# Patient Record
Sex: Male | Born: 1970 | Hispanic: No | Smoking: Former smoker
Health system: Southern US, Community
[De-identification: ages and names within clinical notes are randomized; demographics above are authoritative.]

## PROBLEM LIST (undated history)

## (undated) DIAGNOSIS — I10 Essential (primary) hypertension: Secondary | ICD-10-CM

## (undated) HISTORY — DX: Essential (primary) hypertension: I10

---

## 2003-05-25 ENCOUNTER — Encounter: Admission: RE | Admit: 2003-05-25 | Discharge: 2003-05-25 | Payer: Self-pay | Admitting: Occupational Medicine

## 2005-09-22 ENCOUNTER — Emergency Department (HOSPITAL_COMMUNITY): Admission: EM | Admit: 2005-09-22 | Discharge: 2005-09-22 | Payer: Self-pay | Admitting: Emergency Medicine

## 2009-02-10 ENCOUNTER — Encounter: Admission: RE | Admit: 2009-02-10 | Discharge: 2009-02-10 | Payer: Self-pay | Admitting: Internal Medicine

## 2010-12-11 IMAGING — CR DG CHEST 1V
1 series · 1 of 1 positions shown · non-contrast
Comparison: None

CLINICAL DATA: Pre employment physical

CHEST - 1 VIEW

[view not recorded]
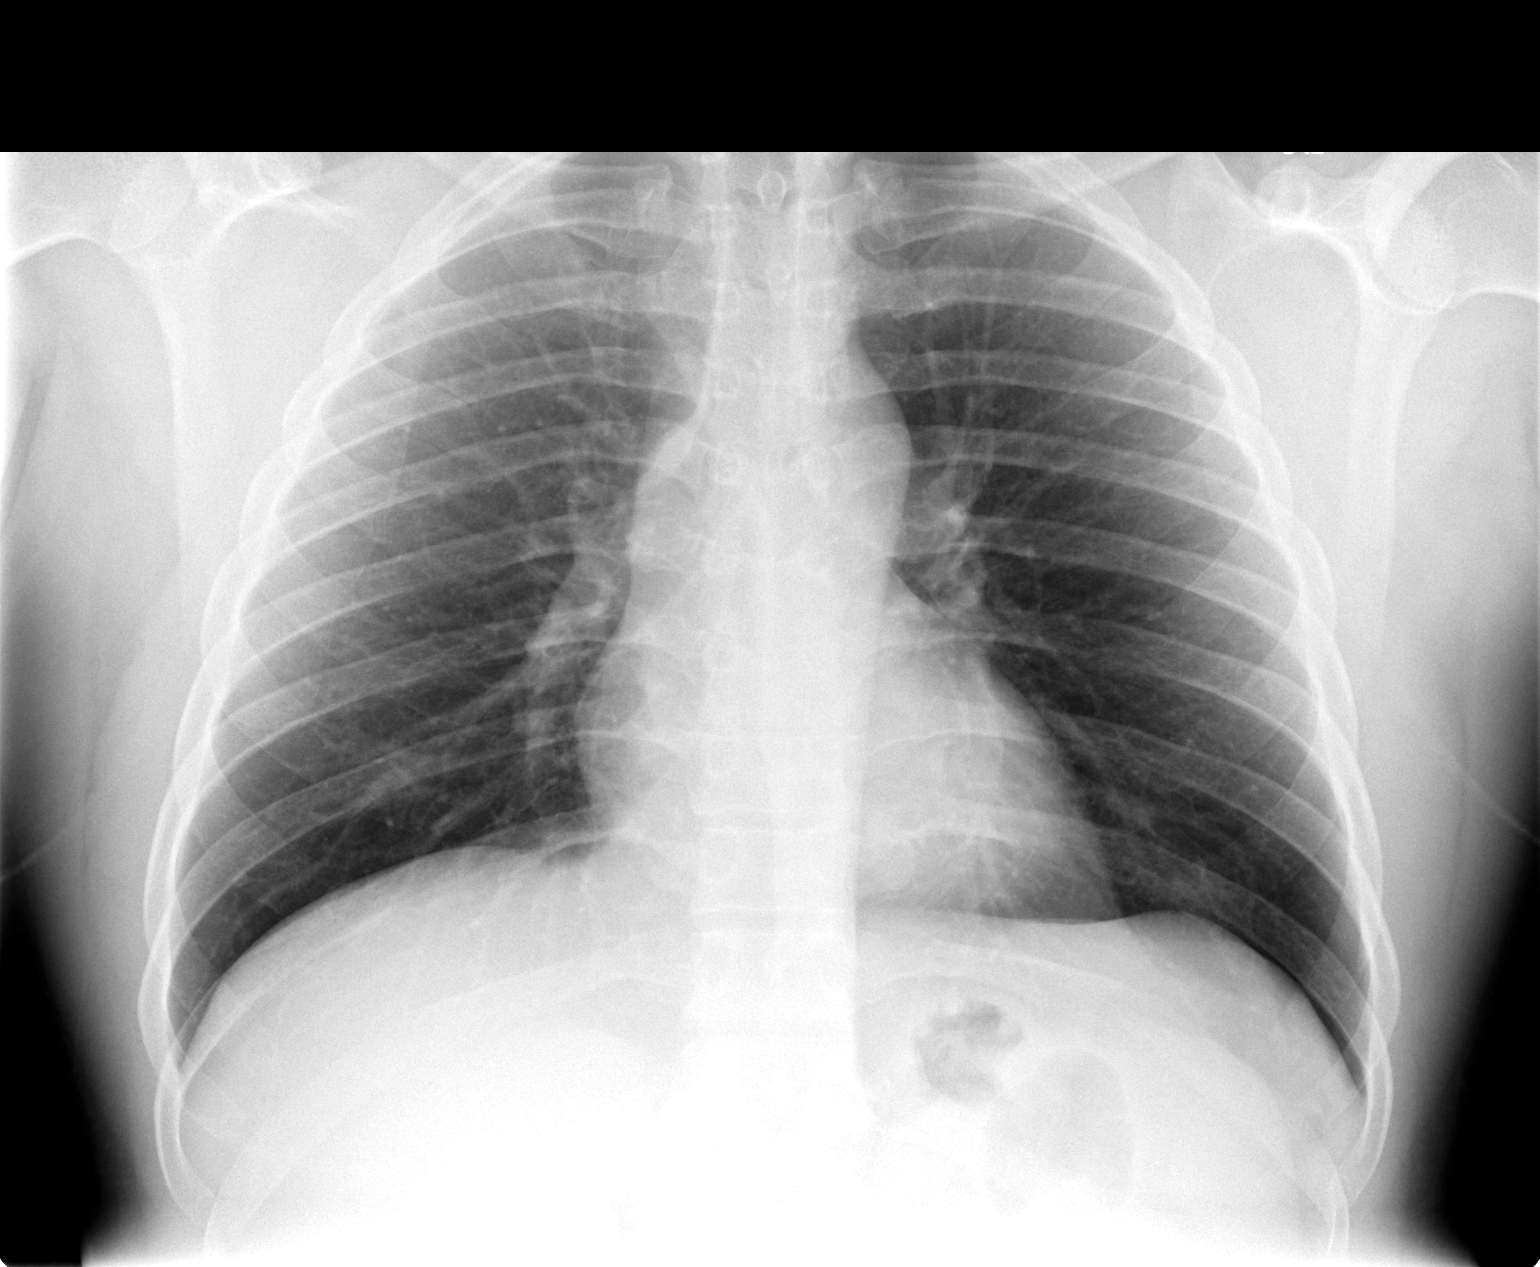

[1 of 1 positions shown; findings below may reference images not displayed]

FINDINGS: Heart and mediastinal contours normal.  Lungs clear.
Osseous structures intact in one-view.
IMPRESSION: No acute or significant findings.

## 2014-03-17 DIAGNOSIS — E669 Obesity, unspecified: Secondary | ICD-10-CM | POA: Insufficient documentation

## 2015-09-27 DIAGNOSIS — R7989 Other specified abnormal findings of blood chemistry: Secondary | ICD-10-CM | POA: Insufficient documentation

## 2016-10-30 DIAGNOSIS — N189 Chronic kidney disease, unspecified: Secondary | ICD-10-CM | POA: Insufficient documentation

## 2016-10-30 DIAGNOSIS — E785 Hyperlipidemia, unspecified: Secondary | ICD-10-CM | POA: Insufficient documentation

## 2019-02-16 DIAGNOSIS — R030 Elevated blood-pressure reading, without diagnosis of hypertension: Secondary | ICD-10-CM | POA: Diagnosis not present

## 2019-02-17 DIAGNOSIS — R030 Elevated blood-pressure reading, without diagnosis of hypertension: Secondary | ICD-10-CM | POA: Diagnosis not present

## 2019-02-18 DIAGNOSIS — R030 Elevated blood-pressure reading, without diagnosis of hypertension: Secondary | ICD-10-CM | POA: Diagnosis not present

## 2019-02-19 DIAGNOSIS — Z0130 Encounter for examination of blood pressure without abnormal findings: Secondary | ICD-10-CM | POA: Diagnosis not present

## 2019-02-20 DIAGNOSIS — I10 Essential (primary) hypertension: Secondary | ICD-10-CM | POA: Diagnosis not present

## 2019-02-25 DIAGNOSIS — I10 Essential (primary) hypertension: Secondary | ICD-10-CM | POA: Diagnosis not present

## 2019-02-28 DIAGNOSIS — I10 Essential (primary) hypertension: Secondary | ICD-10-CM | POA: Diagnosis not present

## 2019-03-03 DIAGNOSIS — I10 Essential (primary) hypertension: Secondary | ICD-10-CM | POA: Diagnosis not present

## 2019-03-05 DIAGNOSIS — Z113 Encounter for screening for infections with a predominantly sexual mode of transmission: Secondary | ICD-10-CM | POA: Diagnosis not present

## 2019-03-05 DIAGNOSIS — Z13220 Encounter for screening for lipoid disorders: Secondary | ICD-10-CM | POA: Diagnosis not present

## 2019-03-05 DIAGNOSIS — Z131 Encounter for screening for diabetes mellitus: Secondary | ICD-10-CM | POA: Diagnosis not present

## 2019-03-05 DIAGNOSIS — I10 Essential (primary) hypertension: Secondary | ICD-10-CM | POA: Diagnosis not present

## 2019-03-26 DIAGNOSIS — Z7189 Other specified counseling: Secondary | ICD-10-CM | POA: Diagnosis not present

## 2019-03-26 DIAGNOSIS — I10 Essential (primary) hypertension: Secondary | ICD-10-CM | POA: Diagnosis not present

## 2019-03-26 DIAGNOSIS — D72819 Decreased white blood cell count, unspecified: Secondary | ICD-10-CM | POA: Diagnosis not present

## 2019-03-26 DIAGNOSIS — R7303 Prediabetes: Secondary | ICD-10-CM | POA: Diagnosis not present

## 2019-05-06 DIAGNOSIS — Z0130 Encounter for examination of blood pressure without abnormal findings: Secondary | ICD-10-CM | POA: Diagnosis not present

## 2019-08-16 DIAGNOSIS — Z13220 Encounter for screening for lipoid disorders: Secondary | ICD-10-CM | POA: Diagnosis not present

## 2019-08-16 DIAGNOSIS — I10 Essential (primary) hypertension: Secondary | ICD-10-CM | POA: Diagnosis not present

## 2019-08-16 DIAGNOSIS — D72819 Decreased white blood cell count, unspecified: Secondary | ICD-10-CM | POA: Diagnosis not present

## 2019-08-16 DIAGNOSIS — Z131 Encounter for screening for diabetes mellitus: Secondary | ICD-10-CM | POA: Diagnosis not present

## 2019-08-16 DIAGNOSIS — R7303 Prediabetes: Secondary | ICD-10-CM | POA: Diagnosis not present

## 2019-10-20 DIAGNOSIS — Z03818 Encounter for observation for suspected exposure to other biological agents ruled out: Secondary | ICD-10-CM | POA: Diagnosis not present

## 2019-10-20 DIAGNOSIS — Z20828 Contact with and (suspected) exposure to other viral communicable diseases: Secondary | ICD-10-CM | POA: Diagnosis not present

## 2019-12-27 DIAGNOSIS — D72819 Decreased white blood cell count, unspecified: Secondary | ICD-10-CM | POA: Diagnosis not present

## 2019-12-27 DIAGNOSIS — E782 Mixed hyperlipidemia: Secondary | ICD-10-CM | POA: Diagnosis not present

## 2019-12-27 DIAGNOSIS — Z13220 Encounter for screening for lipoid disorders: Secondary | ICD-10-CM | POA: Diagnosis not present

## 2019-12-27 DIAGNOSIS — I10 Essential (primary) hypertension: Secondary | ICD-10-CM | POA: Diagnosis not present

## 2019-12-27 DIAGNOSIS — R202 Paresthesia of skin: Secondary | ICD-10-CM | POA: Diagnosis not present

## 2019-12-27 DIAGNOSIS — Z131 Encounter for screening for diabetes mellitus: Secondary | ICD-10-CM | POA: Diagnosis not present

## 2019-12-27 DIAGNOSIS — R7303 Prediabetes: Secondary | ICD-10-CM | POA: Diagnosis not present

## 2020-01-14 DIAGNOSIS — Z20822 Contact with and (suspected) exposure to covid-19: Secondary | ICD-10-CM | POA: Diagnosis not present

## 2020-04-10 DIAGNOSIS — R7303 Prediabetes: Secondary | ICD-10-CM | POA: Diagnosis not present

## 2020-04-10 DIAGNOSIS — Z7189 Other specified counseling: Secondary | ICD-10-CM | POA: Diagnosis not present

## 2020-04-10 DIAGNOSIS — I10 Essential (primary) hypertension: Secondary | ICD-10-CM | POA: Diagnosis not present

## 2020-04-27 NOTE — Progress Notes (Signed)
Patient referred by [name], NP for hypertension  Subjective:   Adam Silva, male    DOB: 1970/10/07, 49 y.o.   MRN: [id number]   Chief Complaint  Patient presents with  . Hypertension  . AV  . New Patient (Initial Visit)     HPI  49 y.o. African American male with hypertension  Patient is originally from [place], works in a [employer]. He exercises regularly, has had pain in his fingers, but denies chest pain, shortness of breath, palpitations, leg edema, orthopnea, PND, TIA/syncope. Blood pressure mildly;y elevated in office today, 120/80 mmHg on checks at his workplace., He states he is now compliant with chlorthalidone and atorvastatin.  He smokes a cigarette or two at work once every 3 months at work, as a part of Designer, jewellery. He does not smoke tobacco daily, does smoke cannabis twice a week. He drinks occasionally on weekends-3 to 4 beers.   Past Medical History:  Diagnosis Date  . Hypertension      History reviewed. No pertinent surgical history.   Social History   Tobacco Use  Smoking Status Current Every Day Smoker  . Packs/day: 0.25  . Types: Cigarettes  Smokeless Tobacco Never Used  Tobacco Comment   socially    Social History   Substance and Sexual Activity  Alcohol Use Yes   Comment: occ     Family History  Problem Relation Age of Onset  . Hypertension Mother   . Heart disease Father   . Heart attack Father   . Hypertension Father      Current Outpatient Medications on File Prior to Visit  Medication Sig Dispense Refill  . Ascorbic Acid (VITAMIN C) 1000 MG tablet Take 1,000 mg by mouth daily.    Marland Kitchen atorvastatin (LIPITOR) 20 MG tablet Take 1 tablet by mouth at bedtime.    . chlorthalidone (HYGROTON) 25 MG tablet Take 1 tablet by mouth daily.     No current facility-administered medications on file prior to visit.    Cardiovascular and other pertinent studies:  EKG 04/28/2020: Sinus rhythm 56 bpm Borderline left  atrial enlargement Diffuse nonspecific T-abnormality  Recent labs: 12/29/2019: Glucose 103, BUN/Cr 15/1.3. EGFR 63/73. Na/K 139/3.7. Rest of the CMP normal H/H 15/46. MCV 83. Platelets 203 HbA1C 5.8% Chol 199, TG 52, HDL 99, LDL 87    Review of Systems  Cardiovascular: Negative for chest pain, dyspnea on exertion, leg swelling, palpitations and syncope.         Vitals:   04/28/20 1045  BP: (!) 141/89  Pulse: 65  Resp: 17  SpO2: 97%     Body mass index is 32.26 kg/m. Filed Weights   04/28/20 1045  Weight: 206 lb (93.4 kg)     Objective:   Physical Exam Vitals and nursing note reviewed.  Constitutional:      General: He is not in acute distress. Neck:     Vascular: No JVD.  Cardiovascular:     Rate and Rhythm: Normal rate and regular rhythm.     Heart sounds: Normal heart sounds. No murmur heard.   Pulmonary:     Effort: Pulmonary effort is normal.     Breath sounds: Normal breath sounds. No wheezing or rales.         Assessment & Recommendations:   49 y.o. African American male with hypertension  Hypertension: Reasonably well controlled. In future, could add amlodipine 5 mg, if necessary. Will obtain echocardiogram.  Lipids well controlled on atorvastatin.  F/u  as needed  Thank you for referring the patient to [place]. Please feel free to contact with any questions.   Nigel Mormon, MD Pager: [phone] Office: [phone]

## 2020-04-28 ENCOUNTER — Ambulatory Visit: Payer: BC Managed Care – PPO | Admitting: Cardiology

## 2020-04-28 ENCOUNTER — Other Ambulatory Visit: Payer: Self-pay

## 2020-04-28 ENCOUNTER — Encounter: Payer: Self-pay | Admitting: Cardiology

## 2020-04-28 VITALS — BP 141/89 | HR 65 | Resp 17 | Ht 67.0 in | Wt 206.0 lb

## 2020-04-28 DIAGNOSIS — I10 Essential (primary) hypertension: Secondary | ICD-10-CM | POA: Insufficient documentation

## 2020-04-29 ENCOUNTER — Ambulatory Visit: Payer: BC Managed Care – PPO

## 2020-04-29 DIAGNOSIS — I10 Essential (primary) hypertension: Secondary | ICD-10-CM | POA: Diagnosis not present

## 2020-05-03 NOTE — Progress Notes (Signed)
Unable to reach pt. Left vm to cb. 

## 2020-05-03 NOTE — Progress Notes (Signed)
Called patient, NA, LMAM

## 2020-05-27 DIAGNOSIS — Z125 Encounter for screening for malignant neoplasm of prostate: Secondary | ICD-10-CM | POA: Diagnosis not present

## 2020-05-27 DIAGNOSIS — R102 Pelvic and perineal pain: Secondary | ICD-10-CM | POA: Diagnosis not present

## 2020-08-04 DIAGNOSIS — R1031 Right lower quadrant pain: Secondary | ICD-10-CM | POA: Diagnosis not present

## 2020-08-04 DIAGNOSIS — R109 Unspecified abdominal pain: Secondary | ICD-10-CM | POA: Insufficient documentation

## 2020-08-24 DIAGNOSIS — E782 Mixed hyperlipidemia: Secondary | ICD-10-CM | POA: Diagnosis not present

## 2020-08-24 DIAGNOSIS — I10 Essential (primary) hypertension: Secondary | ICD-10-CM | POA: Diagnosis not present

## 2020-08-24 DIAGNOSIS — R7303 Prediabetes: Secondary | ICD-10-CM | POA: Diagnosis not present

## 2020-08-24 DIAGNOSIS — Z7189 Other specified counseling: Secondary | ICD-10-CM | POA: Diagnosis not present

## 2023-08-06 DIAGNOSIS — I10 Essential (primary) hypertension: Secondary | ICD-10-CM | POA: Diagnosis not present

## 2023-08-30 ENCOUNTER — Encounter (HOSPITAL_BASED_OUTPATIENT_CLINIC_OR_DEPARTMENT_OTHER): Payer: Self-pay | Admitting: Family Medicine

## 2023-08-30 ENCOUNTER — Ambulatory Visit (HOSPITAL_BASED_OUTPATIENT_CLINIC_OR_DEPARTMENT_OTHER): Payer: BC Managed Care – PPO | Admitting: Family Medicine

## 2023-08-30 VITALS — BP 145/90 | HR 80 | Ht 67.0 in | Wt 203.4 lb

## 2023-08-30 DIAGNOSIS — E785 Hyperlipidemia, unspecified: Secondary | ICD-10-CM | POA: Diagnosis not present

## 2023-08-30 DIAGNOSIS — I10 Essential (primary) hypertension: Secondary | ICD-10-CM | POA: Diagnosis not present

## 2023-08-30 DIAGNOSIS — Z1211 Encounter for screening for malignant neoplasm of colon: Secondary | ICD-10-CM | POA: Diagnosis not present

## 2023-08-30 DIAGNOSIS — Z0000 Encounter for general adult medical examination without abnormal findings: Secondary | ICD-10-CM

## 2023-08-30 NOTE — Progress Notes (Signed)
 New Patient Office Visit  Subjective   Patient ID: Adam Silva, male    DOB: [birth year]  Age: 53 y.o. MRN: [id number]  CC:  Chief Complaint  Patient presents with   New Patient (Initial Visit)    New Patient would like to discuss blood pressure and has not had blood work done in over a year need bp medication refilled amlodipine      HPI Adam Silva presents to establish care Last PCP - [facility]  Hypertension: currently taking amlodipine  5 mg. In the past, has taken chlorthalidone , did not have any issues with medication. Does check BP occasionally at home - 120-130s/70-80s typically. Has been taking amlodipine  for about 2 years.  Chart also notes CKD, HLD. Has not had blood work in >1 year, none available to review in chart.  Patient is originally from [place]. Has lived here for about 22 years. Patient works as event organiser. He enjoys exercising, traveling.  Outpatient Encounter Medications as of 08/30/2023  Medication Sig   amLODipine  (NORVASC ) 5 MG tablet Take 5 mg by mouth daily.   Multiple Vitamin (MULTI-VITAMIN) tablet Take 1 tablet by mouth daily.   [DISCONTINUED] Ascorbic Acid (VITAMIN C) 1000 MG tablet Take 1,000 mg by mouth daily.   [DISCONTINUED] atorvastatin (LIPITOR) 20 MG tablet Take 1 tablet by mouth at bedtime.   [DISCONTINUED] chlorthalidone  (HYGROTON ) 25 MG tablet Take 1 tablet by mouth daily.   No facility-administered encounter medications on file as of 08/30/2023.    Past Medical History:  Diagnosis Date   Hypertension     History reviewed. No pertinent surgical history.  Family History  Problem Relation Age of Onset   Hypertension Mother    Heart disease Father    Heart attack Father    Hypertension Father     Social History   Socioeconomic History   Marital status: Divorced    Spouse name: Not on file   Number of children: 1   Years of education: Not on file   Highest education level: Bachelor's degree (e.g., BA, AB, BS)   Occupational History   Not on file  Tobacco Use   Smoking status: Former    Current packs/day: 0.25    Types: Cigarettes    Passive exposure: Past   Smokeless tobacco: Never   Tobacco comments:    socially  Vaping Use   Vaping status: Former  Substance and Sexual Activity   Alcohol use: Yes    Comment: occ   Drug use: Yes    Types: Marijuana   Sexual activity: Not on file  Other Topics Concern   Not on file  Social History Narrative   Not on file   Social Drivers of Health   Financial Resource Strain: Low Risk  (08/27/2023)   Overall Financial Resource Strain (CARDIA)    Difficulty of Paying Living Expenses: Not very hard  Food Insecurity: No Food Insecurity (08/27/2023)   Hunger Vital Sign    Worried About Running Out of Food in the Last Year: Never true    Ran Out of Food in the Last Year: Never true  Transportation Needs: Unknown (08/27/2023)   PRAPARE - Administrator, Civil Service (Medical): Not on file    Lack of Transportation (Non-Medical): No  Physical Activity: Sufficiently Active (08/27/2023)   Exercise Vital Sign    Days of Exercise per Week: 5 days    Minutes of Exercise per Session: 60 min  Stress: No Stress Concern Present (08/27/2023)  [employer] of Occupational Health - Occupational Stress Questionnaire    Feeling of Stress : Not at all  Social Connections: Moderately Isolated (08/27/2023)   Social Connection and Isolation Panel [NHANES]    Frequency of Communication with Friends and Family: Twice a week    Frequency of Social Gatherings with Friends and Family: Once a week    Attends Religious Services: 1 to 4 times per year    Active Member of [employer] or Organizations: No    Attends Engineer, Structural: Not on file    Marital Status: Divorced  Catering Manager Violence: Not on file    Objective   BP (!) 149/96 (BP Location: Right Arm, Patient Position: Sitting, Cuff Size: Normal)   Pulse 80   Ht 5' 7 (1.702 m)   Wt 203  lb 6.4 oz (92.3 kg)   SpO2 100%   BMI 31.86 kg/m   Physical Exam  53 year old male in no acute distress Cardiovascular exam with regular rate and rhythm Lungs clear to auscultation bilaterally  Assessment & Plan:   Essential hypertension Assessment & Plan: Blood pressure slightly elevated on initial reading, did improve slightly on recheck.  Can continue with current medication regimen given better control reported at home.  We will continue to monitor blood pressure moving forward. We will check baseline labs with upcoming physical   Colon cancer screening -     Ambulatory referral to Gastroenterology  Wellness examination -     CBC with Differential/Platelet; Future -     Comprehensive metabolic panel; Future -     Hemoglobin A1c; Future -     Lipid panel; Future -     TSH Rfx on Abnormal to Free T4; Future  Hyperlipidemia, unspecified hyperlipidemia type Assessment & Plan: Continue with lifestyle modifications Check labs before CPE   Return in about 1 month (around 09/27/2023) for CPE with fasting labs 1 week prior.    ___________________________________________ Nathen Balaban de [place], MD, ABFM, CAQSM Primary Care and Sports Medicine [facility]

## 2023-08-30 NOTE — Assessment & Plan Note (Signed)
 Continue with lifestyle modifications Check labs before CPE

## 2023-08-30 NOTE — Assessment & Plan Note (Signed)
 Blood pressure slightly elevated on initial reading, did improve slightly on recheck.  Can continue with current medication regimen given better control reported at home.  We will continue to monitor blood pressure moving forward. We will check baseline labs with upcoming physical

## 2023-08-30 NOTE — Patient Instructions (Signed)
  Medication Instructions:  Your physician recommends that you continue on your current medications as directed. Please refer to the Current Medication list given to you today. --If you need a refill on any your medications before your next appointment, please call your pharmacy first. If no refills are authorized on file call the office.-- Lab Work: Your physician has recommended that you have lab work today: 1 week before next visit  If you have labs (blood work) drawn today and your tests are completely normal, you will receive your results via MyChart message OR a phone call from our staff.  Please ensure you check your voicemail in the event that you authorized detailed messages to be left on a delegated number. If you have any lab test that is abnormal or we need to change your treatment, we will call you to review the results.    Follow-Up: Your next appointment:   Your physician recommends that you schedule a follow-up appointment in: 1 month physical with Dr. [name]  You will receive a text message or e-mail with a link to a survey about your care and experience with [place] today! We would greatly appreciate your feedback!   Thanks for letting us be apart of your health journey!!  Primary Care and Sports Medicine   Dr. Ceasar Mons [place]   We encourage you to activate your patient portal called "MyChart".  Sign up information is provided on this After Visit Summary.  MyChart is used to connect with patients for Virtual Visits (Telemedicine).  Patients are able to view lab/test results, encounter notes, upcoming appointments, etc.  Non-urgent messages can be sent to your provider as well. To learn more about what you can do with MyChart, please visit --  [email or web address].

## 2023-10-03 ENCOUNTER — Other Ambulatory Visit (HOSPITAL_BASED_OUTPATIENT_CLINIC_OR_DEPARTMENT_OTHER): Payer: Self-pay | Admitting: *Deleted

## 2023-10-03 DIAGNOSIS — Z0000 Encounter for general adult medical examination without abnormal findings: Secondary | ICD-10-CM

## 2023-10-04 LAB — CBC WITH DIFFERENTIAL/PLATELET
Basophils Absolute: 0 10*3/uL (ref 0.0–0.2)
Basos: 1 %
EOS (ABSOLUTE): 0 10*3/uL (ref 0.0–0.4)
Eos: 1 %
Hematocrit: 49.2 % (ref 37.5–51.0)
Hemoglobin: 16.4 g/dL (ref 13.0–17.7)
Immature Grans (Abs): 0 10*3/uL (ref 0.0–0.1)
Immature Granulocytes: 0 %
Lymphocytes Absolute: 1.6 10*3/uL (ref 0.7–3.1)
Lymphs: 58 %
MCH: 27.7 pg (ref 26.6–33.0)
MCHC: 33.3 g/dL (ref 31.5–35.7)
MCV: 83 fL (ref 79–97)
Monocytes Absolute: 0.4 10*3/uL (ref 0.1–0.9)
Monocytes: 12 %
Neutrophils Absolute: 0.8 10*3/uL — ABNORMAL LOW (ref 1.4–7.0)
Neutrophils: 28 %
Platelets: 163 10*3/uL (ref 150–450)
RBC: 5.91 x10E6/uL — ABNORMAL HIGH (ref 4.14–5.80)
RDW: 13.2 % (ref 11.6–15.4)
WBC: 2.9 10*3/uL — ABNORMAL LOW (ref 3.4–10.8)

## 2023-10-04 LAB — COMPREHENSIVE METABOLIC PANEL
ALT: 27 IU/L (ref 0–44)
AST: 36 IU/L (ref 0–40)
Albumin: 4.7 g/dL (ref 3.8–4.9)
Alkaline Phosphatase: 70 IU/L (ref 44–121)
BUN/Creatinine Ratio: 9 (ref 9–20)
BUN: 14 mg/dL (ref 6–24)
Bilirubin Total: 1 mg/dL (ref 0.0–1.2)
CO2: 26 mmol/L (ref 20–29)
Calcium: 10.1 mg/dL (ref 8.7–10.2)
Chloride: 103 mmol/L (ref 96–106)
Creatinine, Ser: 1.5 mg/dL — ABNORMAL HIGH (ref 0.76–1.27)
Globulin, Total: 2.5 g/dL (ref 1.5–4.5)
Glucose: 99 mg/dL (ref 70–99)
Potassium: 4.7 mmol/L (ref 3.5–5.2)
Sodium: 141 mmol/L (ref 134–144)
Total Protein: 7.2 g/dL (ref 6.0–8.5)
eGFR: 56 mL/min/{1.73_m2} — ABNORMAL LOW (ref >59)

## 2023-10-04 LAB — LIPID PANEL
Chol/HDL Ratio: 2 ratio (ref 0.0–5.0)
Cholesterol, Total: 241 mg/dL — ABNORMAL HIGH (ref 100–199)
HDL: 119 mg/dL (ref >39)
LDL Chol Calc (NIH): 114 mg/dL — ABNORMAL HIGH (ref 0–99)
Triglycerides: 47 mg/dL (ref 0–149)
VLDL Cholesterol Cal: 8 mg/dL (ref 5–40)

## 2023-10-04 LAB — HEMOGLOBIN A1C
Est. average glucose Bld gHb Est-mCnc: 123 mg/dL
Hgb A1c MFr Bld: 5.9 % — ABNORMAL HIGH (ref 4.8–5.6)

## 2023-10-04 LAB — TSH RFX ON ABNORMAL TO FREE T4: TSH: 0.86 u[IU]/mL (ref 0.450–4.500)

## 2023-10-09 ENCOUNTER — Ambulatory Visit (INDEPENDENT_AMBULATORY_CARE_PROVIDER_SITE_OTHER): Payer: BC Managed Care – PPO | Admitting: Family Medicine

## 2023-10-09 ENCOUNTER — Other Ambulatory Visit (HOSPITAL_BASED_OUTPATIENT_CLINIC_OR_DEPARTMENT_OTHER): Payer: Self-pay

## 2023-10-09 ENCOUNTER — Encounter (HOSPITAL_BASED_OUTPATIENT_CLINIC_OR_DEPARTMENT_OTHER): Payer: Self-pay | Admitting: Family Medicine

## 2023-10-09 VITALS — BP 155/110 | HR 80 | Ht 67.0 in | Wt 201.3 lb

## 2023-10-09 DIAGNOSIS — Z0000 Encounter for general adult medical examination without abnormal findings: Secondary | ICD-10-CM | POA: Diagnosis not present

## 2023-10-09 DIAGNOSIS — D709 Neutropenia, unspecified: Secondary | ICD-10-CM | POA: Diagnosis not present

## 2023-10-09 DIAGNOSIS — Z1211 Encounter for screening for malignant neoplasm of colon: Secondary | ICD-10-CM

## 2023-10-09 DIAGNOSIS — I10 Essential (primary) hypertension: Secondary | ICD-10-CM

## 2023-10-09 DIAGNOSIS — N1831 Chronic kidney disease, stage 3a: Secondary | ICD-10-CM

## 2023-10-09 MED ORDER — CHLORTHALIDONE 25 MG PO TABS
25.0000 mg | ORAL_TABLET | Freq: Every day | ORAL | 0 refills | Status: DC
  Filled 2023-10-09: qty 30, 30d supply, fill #0
  Filled 2023-11-05: qty 30, 30d supply, fill #1

## 2023-10-09 NOTE — Assessment & Plan Note (Signed)
 Blood pressure elevated in office today.  Does continue with amlodipine.  He reports that he was on chlorthalidone at 1 point the past and felt that this was helpful in regards to blood pressure management.  We discussed options today and he would like to resume this medication in conjunction with amlodipine.  I feel that this would be reasonable.  Prescription sent to pharmacy.  Discussed that if blood pressure is running too low, can cut dose of chlorthalidone in half by taking half tablet daily.  If having persistent issues with blood pressure, recommend returning to the office for further evaluation

## 2023-10-09 NOTE — Patient Instructions (Signed)
  Medication Instructions:  Your physician recommends that you continue on your current medications as directed. Please refer to the Current Medication list given to you today. --If you need a refill on any your medications before your next appointment, please call your pharmacy first. If no refills are authorized on file call the office.-- Lab Work: Your physician has recommended that you have lab work today: 1 week before next visit If you have labs (blood work) drawn today and your tests are completely normal, you will receive your results via MyChart message OR a phone call from our staff.  Please ensure you check your voicemail in the event that you authorized detailed messages to be left on a delegated number. If you have any lab test that is abnormal or we need to change your treatment, we will call you to review the results.    Follow-Up: Your next appointment:   Your physician recommends that you schedule a follow-up appointment in: 6 weeks follow up  with Dr. [name]  You will receive a text message or e-mail with a link to a survey about your care and experience with [place] today! We would greatly appreciate your feedback!   Thanks for letting us be apart of your health journey!!  Primary Care and Sports Medicine   Dr. Ceasar Mons [place]   We encourage you to activate your patient portal called "MyChart".  Sign up information is provided on this After Visit Summary.  MyChart is used to connect with patients for Virtual Visits (Telemedicine).  Patients are able to view lab/test results, encounter notes, upcoming appointments, etc.  Non-urgent messages can be sent to your provider as well. To learn more about what you can do with MyChart, please visit --  [email or web address].

## 2023-10-09 NOTE — Progress Notes (Signed)
 Subjective:    CC: Annual Physical Exam  HPI:  Adam Silva is a 53 y.o. presenting for annual physical  I reviewed the past medical history, family history, social history, surgical history, and allergies today and no changes were needed.  Please see the problem list section below in epic for further details.  Past Medical History: Past Medical History:  Diagnosis Date   Hypertension    Past Surgical History: History reviewed. No pertinent surgical history. Social History: Social History   Socioeconomic History   Marital status: Divorced    Spouse name: Not on file   Number of children: 1   Years of education: Not on file   Highest education level: Bachelor's degree (e.g., BA, AB, BS)  Occupational History   Not on file  Tobacco Use   Smoking status: Former    Current packs/day: 0.25    Types: Cigarettes    Passive exposure: Past   Smokeless tobacco: Never   Tobacco comments:    socially  Vaping Use   Vaping status: Former  Substance and Sexual Activity   Alcohol use: Yes    Comment: occ   Drug use: Yes    Types: Marijuana   Sexual activity: Not on file  Other Topics Concern   Not on file  Social History Narrative   Not on file   Social Drivers of Health   Financial Resource Strain: Low Risk  (08/27/2023)   Overall Financial Resource Strain (CARDIA)    Difficulty of Paying Living Expenses: Not very hard  Food Insecurity: No Food Insecurity (08/27/2023)   Hunger Vital Sign    Worried About Running Out of Food in the Last Year: Never true    Ran Out of Food in the Last Year: Never true  Transportation Needs: Unknown (08/27/2023)   PRAPARE - Administrator, Civil Service (Medical): Not on file    Lack of Transportation (Non-Medical): No  Physical Activity: Sufficiently Active (08/27/2023)   Exercise Vital Sign    Days of Exercise per Week: 5 days    Minutes of Exercise per Session: 60 min  Stress: No Stress Concern Present (08/27/2023)   [employer] of Occupational Health - Occupational Stress Questionnaire    Feeling of Stress : Not at all  Social Connections: Moderately Isolated (08/27/2023)   Social Connection and Isolation Panel [NHANES]    Frequency of Communication with Friends and Family: Twice a week    Frequency of Social Gatherings with Friends and Family: Once a week    Attends Religious Services: 1 to 4 times per year    Active Member of [employer] or Organizations: No    Attends Engineer, structural: Not on file    Marital Status: Divorced   Family History: Family History  Problem Relation Age of Onset   Hypertension Mother    Heart disease Father    Heart attack Father    Hypertension Father    Allergies: No Active Allergies  Medications: See med rec.  Review of Systems: No headache, visual changes, nausea, vomiting, diarrhea, constipation, dizziness, abdominal pain, skin rash, fevers, chills, night sweats, swollen lymph nodes, weight loss, chest pain, body aches, joint swelling, muscle aches, shortness of breath, mood changes, visual or auditory hallucinations.  Objective:    BP (!) 154/92 (BP Location: Left Arm, Patient Position: Sitting, Cuff Size: Normal)   Pulse 80   Ht 5\' 7"  (1.702 m)   Wt 201 lb 4.8 oz (91.3 kg)   SpO2  99%   BMI 31.53 kg/m   General: Well Developed, well nourished, and in no acute distress. Neuro: Alert and oriented x3, extra-ocular muscles intact, sensation grossly intact. Cranial nerves II through XII are intact, motor, sensory, and coordinative functions are all intact. HEENT: Normocephalic, atraumatic, pupils equal round reactive to light, neck supple, no masses, no lymphadenopathy, thyroid nonpalpable. Oropharynx, nasopharynx, external ear canals are unremarkable. Skin: Warm and dry, no rashes noted. Cardiac: Regular rate and rhythm, no murmurs rubs or gallops. Respiratory: Clear to auscultation bilaterally. Not using accessory muscles, speaking in full  sentences. Abdominal: Soft, nontender, nondistended, positive bowel sounds, no masses, no organomegaly. Musculoskeletal: Shoulder, elbow, wrist, hip, knee, ankle stable, and with full range of motion.  Impression and Recommendations:    Wellness examination Assessment & Plan: Routine HCM labs reviewed. HCM reviewed/discussed. Anticipatory guidance regarding healthy weight, lifestyle and choices given. Recommend healthy diet.  Recommend approximately 150 minutes/week of moderate intensity exercise Recommend regular dental and vision exams Always use seatbelt/lap and shoulder restraints Recommend using smoke alarms and checking batteries at least twice a year Recommend using sunscreen when outside Discussed colon cancer screening recommendations, options.  Patient would like to proceed with colonoscopy, referral placed Discussed recommendations for shingles vaccine.  Patient will consider Discussed tetanus immunization recommendations, patient is UTD   Neutropenia, unspecified type [facility]) Assessment & Plan: Noted on recent labs, mild to moderate in nature.  No known history.  Can proceed with recheck of labs in the near future, he will return to have labs completed before our next appointment for monitoring.  If persisting, consider referral to hematology for further evaluation  Orders: -     CBC with Differential/Platelet; Future  Colon cancer screening -     Ambulatory referral to Gastroenterology  Stage 3a chronic kidney disease [facility]) Assessment & Plan: Noted in the past, found to have EGFR of 56 on recent labs with physical.  Uncertain of baseline as there are no prior records to review.  We discussed considerations today.  Patient would like to meet with nephrologist and is requesting referral today, referral placed.  Discussed general considerations related to impaired kidney function, exercise caution with certain medications, ensure adequate hydration.  Orders: -     Ambulatory  referral to Nephrology  Essential hypertension Assessment & Plan: Blood pressure elevated in office today.  Does continue with amlodipine.  He reports that he was on chlorthalidone at 1 point the past and felt that this was helpful in regards to blood pressure management.  We discussed options today and he would like to resume this medication in conjunction with amlodipine.  I feel that this would be reasonable.  Prescription sent to pharmacy.  Discussed that if blood pressure is running too low, can cut dose of chlorthalidone in half by taking half tablet daily.  If having persistent issues with blood pressure, recommend returning to the office for further evaluation   Other orders -     Chlorthalidone; Take 1 tablet (25 mg total) by mouth daily.  Dispense: 90 tablet; Refill: 0  Return in about 6 weeks (around 11/20/2023) for hypertension.   ___________________________________________ Byanca Kasper [name], MD, ABFM, CAQSM Primary Care and Sports Medicine [facility]

## 2023-10-09 NOTE — Assessment & Plan Note (Signed)
 Noted on recent labs, mild to moderate in nature.  No known history.  Can proceed with recheck of labs in the near future, he will return to have labs completed before our next appointment for monitoring.  If persisting, consider referral to hematology for further evaluation

## 2023-10-09 NOTE — Assessment & Plan Note (Signed)
 Routine HCM labs reviewed. HCM reviewed/discussed. Anticipatory guidance regarding healthy weight, lifestyle and choices given. Recommend healthy diet.  Recommend approximately 150 minutes/week of moderate intensity exercise Recommend regular dental and vision exams Always use seatbelt/lap and shoulder restraints Recommend using smoke alarms and checking batteries at least twice a year Recommend using sunscreen when outside Discussed colon cancer screening recommendations, options.  Patient would like to proceed with colonoscopy, referral placed Discussed recommendations for shingles vaccine.  Patient will consider Discussed tetanus immunization recommendations, patient is UTD

## 2023-10-09 NOTE — Assessment & Plan Note (Signed)
 Noted in the past, found to have EGFR of 56 on recent labs with physical.  Uncertain of baseline as there are no prior records to review.  We discussed considerations today.  Patient would like to meet with nephrologist and is requesting referral today, referral placed.  Discussed general considerations related to impaired kidney function, exercise caution with certain medications, ensure adequate hydration.

## 2023-11-05 ENCOUNTER — Other Ambulatory Visit (HOSPITAL_BASED_OUTPATIENT_CLINIC_OR_DEPARTMENT_OTHER): Payer: Self-pay

## 2023-11-23 ENCOUNTER — Other Ambulatory Visit (HOSPITAL_BASED_OUTPATIENT_CLINIC_OR_DEPARTMENT_OTHER): Payer: Self-pay | Admitting: Family Medicine

## 2023-11-23 DIAGNOSIS — D709 Neutropenia, unspecified: Secondary | ICD-10-CM | POA: Diagnosis not present

## 2023-11-24 LAB — CBC WITH DIFFERENTIAL/PLATELET
Basophils Absolute: 0 10*3/uL (ref 0.0–0.2)
Basos: 1 %
EOS (ABSOLUTE): 0 10*3/uL (ref 0.0–0.4)
Eos: 2 %
Hematocrit: 46.2 % (ref 37.5–51.0)
Hemoglobin: 14.8 g/dL (ref 13.0–17.7)
Immature Grans (Abs): 0 10*3/uL (ref 0.0–0.1)
Immature Granulocytes: 0 %
Lymphocytes Absolute: 1.6 10*3/uL (ref 0.7–3.1)
Lymphs: 61 %
MCH: 27.2 pg (ref 26.6–33.0)
MCHC: 32 g/dL (ref 31.5–35.7)
MCV: 85 fL (ref 79–97)
Monocytes Absolute: 0.3 10*3/uL (ref 0.1–0.9)
Monocytes: 11 %
Neutrophils Absolute: 0.7 10*3/uL — ABNORMAL LOW (ref 1.4–7.0)
Neutrophils: 25 %
Platelets: 182 10*3/uL (ref 150–450)
RBC: 5.44 x10E6/uL (ref 4.14–5.80)
RDW: 13.7 % (ref 11.6–15.4)
WBC: 2.6 10*3/uL — ABNORMAL LOW (ref 3.4–10.8)

## 2023-11-27 ENCOUNTER — Encounter (HOSPITAL_BASED_OUTPATIENT_CLINIC_OR_DEPARTMENT_OTHER): Payer: Self-pay | Admitting: Family Medicine

## 2023-11-27 ENCOUNTER — Other Ambulatory Visit (HOSPITAL_BASED_OUTPATIENT_CLINIC_OR_DEPARTMENT_OTHER): Payer: Self-pay

## 2023-11-27 ENCOUNTER — Ambulatory Visit (INDEPENDENT_AMBULATORY_CARE_PROVIDER_SITE_OTHER): Admitting: Family Medicine

## 2023-11-27 VITALS — BP 134/88 | HR 70 | Ht 67.0 in | Wt 197.3 lb

## 2023-11-27 DIAGNOSIS — I10 Essential (primary) hypertension: Secondary | ICD-10-CM | POA: Diagnosis not present

## 2023-11-27 DIAGNOSIS — D709 Neutropenia, unspecified: Secondary | ICD-10-CM

## 2023-11-27 DIAGNOSIS — N1831 Chronic kidney disease, stage 3a: Secondary | ICD-10-CM | POA: Diagnosis not present

## 2023-11-27 MED ORDER — AMLODIPINE BESYLATE 2.5 MG PO TABS
2.5000 mg | ORAL_TABLET | Freq: Every day | ORAL | 1 refills | Status: DC
  Filled 2023-11-27: qty 30, 30d supply, fill #0
  Filled 2023-12-30: qty 30, 30d supply, fill #1
  Filled 2024-01-01: qty 30, 30d supply, fill #2
  Filled 2024-02-25: qty 30, 30d supply, fill #3

## 2023-11-27 MED ORDER — CHLORTHALIDONE 25 MG PO TABS
25.0000 mg | ORAL_TABLET | Freq: Every day | ORAL | 1 refills | Status: DC
  Filled 2023-11-27 – 2023-11-29 (×2): qty 30, 30d supply, fill #0

## 2023-11-27 NOTE — Patient Instructions (Signed)
  Medication Instructions:  Your physician recommends that you continue on your current medications as directed. Please refer to the Current Medication list given to you today. --If you need a refill on any your medications before your next appointment, please call your pharmacy first. If no refills are authorized on file call the office.--   Referrals/Procedures/Imaging: Hematology [facility] - [phone] [facility] - [phone]  Follow-Up: Your next appointment:   Your physician recommends that you schedule a follow-up appointment in: 3 months follow up  with Dr. [name]  You will receive a text message or e-mail with a link to (a survey about your care and experience with us  today! We would greatly appreciate your feedback!   Thanks for letting us  be apart of your health journey!!  Primary Care and Sports Medicine   Dr. Court Distance [place]   We encourage you to activate your patient portal called "MyChart".  Sign up information is provided on this After Visit Summary.  MyChart is used to connect with patients for Virtual Visits (Telemedicine).  Patients are able to view lab/test results, encounter notes, upcoming appointments, etc.  Non-urgent messages can be sent to your provider as well. To learn more about what you can do with MyChart, please visit --  [email or web address].

## 2023-11-27 NOTE — Assessment & Plan Note (Signed)
 Previously referred to nephrology, however patient reports that he has not heard from the office yet.  We did provide patient with contact information today so he may reach out to schedule appointment.

## 2023-11-27 NOTE — Assessment & Plan Note (Signed)
 Blood pressure improved in office today, still slightly above goal however.  No current symptoms.  Has been checking blood pressure at home which has been improved according to patient.  He indicates that he has been taking the chlorthalidone , however did stop the amlodipine. We discussed options today and we we will have patient continue with chlorthalidone  and we will restart amlodipine as well, however we will do so at lower dose of 2.5 mg.  Advised to monitor blood pressure closely.  Recommend intermittent monitoring of blood pressure at home, DASH diet.

## 2023-11-27 NOTE — Assessment & Plan Note (Signed)
 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  We discussed options today and we will proceed with referral to hematology for further evaluation.

## 2023-11-27 NOTE — Progress Notes (Signed)
    Procedures performed today:    None.  Independent interpretation of notes and tests performed by another provider:   None.  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  We discussed options today and we will proceed with referral to hematology for further evaluation.  Orders: -     Ambulatory referral to Hematology / Oncology  Essential hypertension Assessment & Plan: Blood pressure improved in office today, still slightly above goal however.  No current symptoms.  Has been checking blood pressure at home which has been improved according to patient.  He indicates that he has been taking the chlorthalidone , however did stop the amlodipine. We discussed options today and we we will have patient continue with chlorthalidone  and we will restart amlodipine as well, however we will do so at lower dose of 2.5 mg.  Advised to monitor blood pressure closely.  Recommend intermittent monitoring of blood pressure at home, DASH diet.   Stage 3a chronic kidney disease [facility]) Assessment & Plan: Previously referred to nephrology, however patient reports that he has not heard from the office yet.  We did provide patient with contact information today so he may reach out to schedule appointment.   Other orders -     Chlorthalidone ; Take 1 tablet (25 mg total) by mouth daily.  Dispense: 90 tablet; Refill: 1 -     amLODIPine Besylate; Take 1 tablet (2.5 mg total) by mouth daily.  Dispense: 90 tablet; Refill: 1  Return in about 3 months (around 02/27/2024) for hypertension.   ___________________________________________ Quenesha Douglass [name], MD, ABFM, CAQSM Primary  Care and Sports Medicine [facility]

## 2023-11-28 ENCOUNTER — Other Ambulatory Visit (HOSPITAL_BASED_OUTPATIENT_CLINIC_OR_DEPARTMENT_OTHER): Payer: Self-pay

## 2023-11-29 ENCOUNTER — Other Ambulatory Visit (HOSPITAL_BASED_OUTPATIENT_CLINIC_OR_DEPARTMENT_OTHER): Payer: Self-pay

## 2023-11-29 ENCOUNTER — Other Ambulatory Visit: Payer: Self-pay

## 2023-12-11 ENCOUNTER — Other Ambulatory Visit: Payer: Self-pay | Admitting: Internal Medicine

## 2023-12-11 DIAGNOSIS — N1831 Chronic kidney disease, stage 3a: Secondary | ICD-10-CM | POA: Diagnosis not present

## 2023-12-11 DIAGNOSIS — I129 Hypertensive chronic kidney disease with stage 1 through stage 4 chronic kidney disease, or unspecified chronic kidney disease: Secondary | ICD-10-CM | POA: Diagnosis not present

## 2023-12-11 DIAGNOSIS — E785 Hyperlipidemia, unspecified: Secondary | ICD-10-CM | POA: Diagnosis not present

## 2023-12-11 DIAGNOSIS — D709 Neutropenia, unspecified: Secondary | ICD-10-CM | POA: Diagnosis not present

## 2023-12-12 ENCOUNTER — Ambulatory Visit
Admission: RE | Admit: 2023-12-12 | Discharge: 2023-12-12 | Disposition: A | Discharge status: Home / Self Care | Source: Ambulatory Visit | Attending: Internal Medicine | Admitting: Internal Medicine

## 2023-12-12 DIAGNOSIS — N189 Chronic kidney disease, unspecified: Secondary | ICD-10-CM | POA: Diagnosis not present

## 2023-12-12 DIAGNOSIS — N1831 Chronic kidney disease, stage 3a: Secondary | ICD-10-CM

## 2023-12-20 ENCOUNTER — Inpatient Hospital Stay: Attending: Nurse Practitioner | Admitting: Nurse Practitioner

## 2023-12-20 ENCOUNTER — Other Ambulatory Visit: Payer: Self-pay | Admitting: Nurse Practitioner

## 2023-12-20 ENCOUNTER — Inpatient Hospital Stay

## 2023-12-20 ENCOUNTER — Encounter: Payer: Self-pay | Admitting: Nurse Practitioner

## 2023-12-20 VITALS — BP 136/84 | HR 80 | Temp 97.9°F | Resp 16 | Wt 198.3 lb

## 2023-12-20 DIAGNOSIS — N189 Chronic kidney disease, unspecified: Secondary | ICD-10-CM | POA: Diagnosis not present

## 2023-12-20 DIAGNOSIS — I129 Hypertensive chronic kidney disease with stage 1 through stage 4 chronic kidney disease, or unspecified chronic kidney disease: Secondary | ICD-10-CM | POA: Insufficient documentation

## 2023-12-20 DIAGNOSIS — D709 Neutropenia, unspecified: Secondary | ICD-10-CM | POA: Insufficient documentation

## 2023-12-20 DIAGNOSIS — I10 Essential (primary) hypertension: Secondary | ICD-10-CM | POA: Diagnosis not present

## 2023-12-20 DIAGNOSIS — Z79899 Other long term (current) drug therapy: Secondary | ICD-10-CM | POA: Diagnosis not present

## 2023-12-20 DIAGNOSIS — Z87891 Personal history of nicotine dependence: Secondary | ICD-10-CM | POA: Diagnosis not present

## 2023-12-20 LAB — CBC WITH DIFFERENTIAL (CANCER CENTER ONLY)
Abs Immature Granulocytes: 0 10*3/uL (ref 0.00–0.07)
Basophils Absolute: 0 10*3/uL (ref 0.0–0.1)
Basophils Relative: 1 %
Eosinophils Absolute: 0 10*3/uL (ref 0.0–0.5)
Eosinophils Relative: 1 %
HCT: 45.6 % (ref 39.0–52.0)
Hemoglobin: 15.4 g/dL (ref 13.0–17.0)
Immature Granulocytes: 0 %
Lymphocytes Relative: 57 %
Lymphs Abs: 1.3 10*3/uL (ref 0.7–4.0)
MCH: 27.9 pg (ref 26.0–34.0)
MCHC: 33.8 g/dL (ref 30.0–36.0)
MCV: 82.6 fL (ref 80.0–100.0)
Monocytes Absolute: 0.3 10*3/uL (ref 0.1–1.0)
Monocytes Relative: 11 %
Neutro Abs: 0.7 10*3/uL — ABNORMAL LOW (ref 1.7–7.7)
Neutrophils Relative %: 30 %
Platelet Count: 184 10*3/uL (ref 150–400)
RBC: 5.52 MIL/uL (ref 4.22–5.81)
RDW: 13.6 % (ref 11.5–15.5)
WBC Count: 2.3 10*3/uL — ABNORMAL LOW (ref 4.0–10.5)
nRBC: 0 % (ref 0.0–0.2)

## 2023-12-20 LAB — SAVE SMEAR(SSMR), FOR PROVIDER SLIDE REVIEW

## 2023-12-20 LAB — VITAMIN B12: Vitamin B-12: 365 pg/mL (ref 180–914)

## 2023-12-20 NOTE — Progress Notes (Addendum)
 [facility]   Telephone:(336) [phone] Fax:(336) [phone]   Clinic New consult Note   Patient Care Team: de Silva, Adam Jansky, MD as PCP - General (Family Medicine) 12/20/2023  CHIEF COMPLAINTS/PURPOSE OF CONSULTATION:  Neutropenia, referred by PCP  HISTORY OF PRESENTING ILLNESS:  Adam Silva 53 y.o. male with PMH including HTN, CKD, HL, is here because of low white count.  Previous blood work has been at urgent cares and onsite wellness checks at his job.  Became aware of low white blood cell count in 2016 but not sure the number.  He changed his diet and began exercising more and counts remain low.  First available CBC 10/03/2023 showed WBC 2.9, ANC 0.8, with normal Hgb and platelets.  LFTs are normal.  Repeat CBC 11/23/2023 showed WBC 2.6, ANC 0.7.  Thinks his father may have had low white count but not sure.  HIV negative at the health department and received hepatitis vaccines.  Has been taking chlorthalidone  since 2015.  Denies history of recurrent infections, autoimmune condition, nutritional deficiencies, recent hospitalizations, or splenectomy.   Socially, he is from [place] moved here in 2000, divorced with 1 child.  Drinks alcohol 2-3 times a week.  Smoked marijuana almost daily for past 10 to 12 years but has cut back.  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   Today he presents by himself, feeling well in general with no specific complaints.  He has good energy and appetite, eats healthy and exercises.  He has night sweats in the summer months but not much in the winter.  Denies fever, unintentional weight loss, or adenopathy.    MEDICAL HISTORY:  Past Medical History:  Diagnosis Date   Hypertension     SURGICAL HISTORY: No past surgical history on file.  SOCIAL HISTORY: Social History   Socioeconomic History   Marital status: Divorced    Spouse name: Not on file   Number of children: 1   Years of  education: Not on file   Highest education level: Bachelor's degree (e.g., BA, AB, BS)  Occupational History   Not on file  Tobacco Use   Smoking status: Former    Current packs/day: 0.25    Types: Cigarettes    Passive exposure: Past   Smokeless tobacco: Never   Tobacco comments:    socially  Vaping Use   Vaping status: Former  Substance and Sexual Activity   Alcohol use: Yes    Comment: occ   Drug use: Yes    Types: Marijuana   Sexual activity: Not on file  Other Topics Concern   Not on file  Social History Narrative   Not on file   Social Drivers of Health   Financial Resource Strain: Low Risk  (08/27/2023)   Overall Financial Resource Strain (CARDIA)    Difficulty of Paying Living Expenses: Not very hard  Food Insecurity: No Food Insecurity (12/20/2023)   Hunger Vital Sign    Worried About Running Out of Food in the Last Year: Never true    Ran Out of Food in the Last Year: Never true  Transportation Needs: No Transportation Needs (12/20/2023)   PRAPARE - Administrator, Civil Service (Medical): No    Lack of Transportation (Non-Medical): No  Physical Activity: Sufficiently Active (08/27/2023)   Exercise Vital Sign    Days of Exercise per Week: 5 days    Minutes of Exercise per Session: 60 min  Stress: No Stress Concern Present (08/27/2023)   [employer] of Occupational Health - Occupational Stress Questionnaire    Feeling of Stress : Not at all  Social Connections: Moderately Isolated (08/27/2023)   Social Connection and Isolation Panel [NHANES]    Frequency of Communication with Friends and Family: Twice a week    Frequency of Social Gatherings with Friends and Family: Once a week    Attends Religious Services: 1 to 4 times per year    Active Member of [employer] or Organizations: No    Attends Engineer, structural: Not on file    Marital Status: Divorced  Intimate Partner Violence: Not At Risk (12/20/2023)   Humiliation, Afraid, Rape, and Kick  questionnaire    Fear of Current or Ex-Partner: No    Emotionally Abused: No    Physically Abused: No    Sexually Abused: No    FAMILY HISTORY: Family History  Problem Relation Age of Onset   Hypertension Mother    Heart disease Father    Heart attack Father    Hypertension Father     ALLERGIES:  has no active allergies.  MEDICATIONS:  Current Outpatient Medications  Medication Sig Dispense Refill   ascorbic acid (VITAMIN C) 500 MG tablet Take 500 mg by mouth daily.     chlorthalidone  (HYGROTON ) 25 MG tablet Take 1 tablet (25 mg total) by mouth daily. 90 tablet 1   Cholecalciferol (VITAMIN D) 50 MCG (2000 UT) CAPS Take 50 capsules by mouth daily. Taking 50 MCG ( 2000 UT) caps daily     amLODipine  (NORVASC ) 2.5 MG tablet Take 1 tablet (2.5 mg total) by mouth daily. (Patient not taking: Reported on 12/20/2023) 90 tablet 1   Multiple Vitamin (MULTI-VITAMIN) tablet Take 1 tablet by mouth daily. (Patient not taking: Reported on 12/20/2023)     No current facility-administered medications for this visit.    REVIEW OF SYSTEMS:   Constitutional: Denies fevers, chills or abnormal night sweats Eyes: Denies blurriness of vision, double vision or watery eyes Ears, nose, mouth, throat, and face: Denies mucositis or sore throat Respiratory: Denies cough, dyspnea or wheezes Cardiovascular: Denies palpitation, chest discomfort or lower extremity swelling Gastrointestinal:  Denies nausea, heartburn or change in bowel habits Skin: Denies abnormal skin rashes Lymphatics: Denies new lymphadenopathy or easy bruising Neurological:Denies numbness, tingling or new weaknesses Behavioral/Psych: Mood is stable, no new changes  All other systems were reviewed with the patient and are negative.  PHYSICAL EXAMINATION: ECOG PERFORMANCE STATUS: 0 - Asymptomatic  Vitals:   12/20/23 0826 12/20/23 0838  BP: (!) 143/100 136/84  Pulse: 80   Resp: 16   Temp: 97.9 F (36.6 C)   SpO2: 100%    Filed  Weights   12/20/23 0826  Weight: 198 lb 4.8 oz (89.9 kg)    GENERAL:alert, no distress and comfortable SKIN:  no rashes or significant lesions EYES:  sclera clear  NECK: without mass LYMPH:  no palpable cervical, supraclavicular, axillary or inguinal lymphadenopathy  LUNGS: clear with normal breathing effort HEART: regular rate & rhythm, no lower extremity edema ABDOMEN:abdomen soft, non-tender and normal bowel sounds Musculoskeletal:no cyanosis of digits and no clubbing  PSYCH: alert & oriented x 3 with fluent speech NEURO: no focal motor/sensory deficits  LABORATORY DATA:  I have reviewed the data as listed    Latest Ref Rng & Units 12/20/2023    8:05 AM 11/23/2023    8:54 AM 10/03/2023    9:05 AM  CBC  WBC 4.0 -  10.5 K/uL 2.3  2.6  2.9   Hemoglobin 13.0 - 17.0 g/dL 16.1  09.6  04.5   Hematocrit 39.0 - 52.0 % 45.6  46.2  49.2   Platelets 150 - 400 K/uL 184  182  163        Latest Ref Rng & Units 10/03/2023    9:05 AM  CMP  Glucose 70 - 99 mg/dL 99   BUN 6 - 24 mg/dL 14   Creatinine 4.09 - 1.27 mg/dL 8.11   Sodium 914 - 782 mmol/L 141   Potassium 3.5 - 5.2 mmol/L 4.7   Chloride 96 - 106 mmol/L 103   CO2 20 - 29 mmol/L 26   Calcium 8.7 - 10.2 mg/dL 95.6   Total Protein 6.0 - 8.5 g/dL 7.2   Total Bilirubin 0.0 - 1.2 mg/dL 1.0   Alkaline Phos 44 - 121 IU/L 70   AST 0 - 40 IU/L 36   ALT 0 - 44 IU/L 27    Peripheral blood smear:  Platelets appear normal in number. Red cell polychromasia is not increased. No nucleated red cells. Few targets, ovalocytes, and teardrops. The white count appears low, the majority are mature appearing lymphocytes. No blasts or other young forms. No monotony of white cell populations  RADIOGRAPHIC STUDIES: I have personally reviewed the radiological images as listed and agreed with the findings in the report. US  RENAL Result Date: 12/14/2023 CLINICAL DATA:  Chronic renal disease EXAM: RENAL / URINARY TRACT ULTRASOUND COMPLETE COMPARISON:  None  Available. FINDINGS: Right Kidney: Renal measurements: 10.1 x 4.6 x 4.5 cm = volume: 109.6 mL. Echogenicity within normal limits. No mass or hydronephrosis visualized. Left Kidney: Renal measurements: 10.0 x 4.9 x 5.3 cm = volume: 135.3 mL. Echogenicity within normal limits. No mass or hydronephrosis visualized. Bladder: Appears normal for degree of bladder distention. Other: None. IMPRESSION: No hydronephrosis. Electronically Signed   By: Jone Neither M.D.   On: 12/14/2023 15:50    ASSESSMENT & PLAN: 53 yo male from [place]  Leukopenia; isolated neutropenia  -We reviewed his medical record in detail with the patient. Evidently he has had leukopenia since 2015/2016 but records are not available -Since 09/2023 WBC 2.3 - 2.9 with ANC 0.7 - 0.8 -We reviewed common etiologies for isolated neutropenia. We are checking B12 and folate to rule out nutritional deficiencies  -No known liver disease, infections, or splenectomy. Checking ANA to r/o autoimmune component  -He is from [place], we reviewed that neutropenia is a common normal variant in native Africans.  -Peripheral blood smear is not consistent with hematologic malignancy.  -We recommend infection precautions, immunizations such as flu and pneumonia, and monitor blood counts. He does not need treatment at this level -Will ask PCP to repeat CBC at next visit in 03/2024, then we will see him back with lab in 6 months.  -Pt seen with Dr. Scherrie Curt   HTN -Currently on chlorthalidone , not strongly linked to neutropenia  -Per PCP  Age appropriate wellness  -Needs updating PSA and colonoscopy  -Encouraged smoking cessation and health/active lifestyle   PLAN: -Medical record, today's labs, and blood smear reviewed -Neutropenia is likely normal variant or autoimmune, will follow up pending B12, folate, and ANA -CBC/diff with PCP at next visit, then see us  in 6 months -Infection precautions, vaccines (flu when appropriate plus PCV20 or PCV21 alone, OR  PCV15 followed by PCV23 8-weeks apart for immunocompromised adults), and age appropriate wellness     Orders Placed This Encounter  Procedures  ANA, IFA (with reflex)    Standing Status:   Future    Number of Occurrences:   1    Expected Date:   12/20/2023    Expiration Date:   12/19/2024   CBC with Differential (Cancer Center Only)    Standing Status:   Future    Expected Date:   06/21/2024    Expiration Date:   12/19/2024     All questions were answered. The patient knows to call the clinic with any problems, questions or concerns.      Kemo Spruce K Samra Pesch, NP 12/20/23   This was a shared visit with Correna Meacham.  Mr.Guinta reviewed and examined.  Peripheral blood smear.  He is referred for evaluation of neutropenia.  The neutropenia appears to be longstanding by his report.  There is no other associated hematologic abnormality.  The neutropenia is most likely related to a benign normal variant or autoimmune neutropenia.  We have a low clinical suspicion for a hematopoietic malignancy or other bone marrow process.  Chlorthalidone  can be associated with leukopenia/neutropenia.  We will have further discussion with him regarding the timing of his chlorthalidone  therapy.  If the chlorthalidone  was started for he was aware of the neutropenia we will ask his primary provider to consider changing to a different antihypertensive regimen.  We recommend he seek medical attention for a fever or symptoms of an infection.  He will obtain influenza and pneumonia vaccines.  He will return for an office and lab visit in 6 months.  I was present for greater than 50% of today's visit.  I performed Medical Decision Making.  Anise Kerns, MD

## 2023-12-21 LAB — FOLATE RBC
Folate, Hemolysate: 341 ng/mL
Folate, RBC: 703 ng/mL (ref >498)
Hematocrit: 48.5 % (ref 37.5–51.0)

## 2023-12-24 ENCOUNTER — Telehealth (HOSPITAL_BASED_OUTPATIENT_CLINIC_OR_DEPARTMENT_OTHER): Payer: Self-pay | Admitting: *Deleted

## 2023-12-24 NOTE — Telephone Encounter (Signed)
 Called patient he will hold chlorathalidone and he is now scheduled for 8/8 for follow up

## 2023-12-24 NOTE — Telephone Encounter (Signed)
-----   Message from Pamella Boer Peru sent at 12/24/2023 10:31 AM EDT ----- Holding chlorthalidone  would be fine.  Jorge Newcomer, can we schedule sooner follow-up with patient to monitor BP with making medication change. Please schedule follow-up in about 8 weeks. ----- Message ----- From: Burton, Lacie K, NP Sent: 12/21/2023  11:25 AM EDT To: Raymond J [name], MD  Hi Dr. Peru,  We saw Mr. Tamez yesterday. His mild neutropenia is either benign normal/ethnic vairiant (common in pt's of African descent), or autoimmune. It's possible chlorthalidone  is contributing as well. If it's ok with you and his BP is not elevated, could he hold chlorthalidone  and recheck CBC in ~6 weeks to see if that has any impact?  Thanks Lacie NP with Dr. Scherrie Curt

## 2023-12-28 LAB — ANTINUCLEAR ANTIBODIES, IFA: ANA Ab, IFA: NEGATIVE

## 2024-01-01 ENCOUNTER — Other Ambulatory Visit (HOSPITAL_BASED_OUTPATIENT_CLINIC_OR_DEPARTMENT_OTHER): Payer: Self-pay

## 2024-01-17 ENCOUNTER — Ambulatory Visit: Payer: Self-pay | Admitting: Nurse Practitioner

## 2024-02-29 ENCOUNTER — Encounter (HOSPITAL_BASED_OUTPATIENT_CLINIC_OR_DEPARTMENT_OTHER): Payer: Self-pay | Admitting: Family Medicine

## 2024-02-29 ENCOUNTER — Ambulatory Visit (INDEPENDENT_AMBULATORY_CARE_PROVIDER_SITE_OTHER): Admitting: Family Medicine

## 2024-02-29 VITALS — BP 146/99 | HR 75 | Ht 67.0 in | Wt 205.2 lb

## 2024-02-29 DIAGNOSIS — I10 Essential (primary) hypertension: Secondary | ICD-10-CM

## 2024-02-29 DIAGNOSIS — D709 Neutropenia, unspecified: Secondary | ICD-10-CM

## 2024-02-29 MED ORDER — CHLORTHALIDONE 25 MG PO TABS: 25.0000 mg | ORAL_TABLET | Freq: Every day | ORAL | 1 refills | Status: DC

## 2024-02-29 NOTE — Patient Instructions (Signed)
  Medication Instructions:  Your physician recommends that you continue on your current medications as directed. Please refer to the Current Medication list given to you today. --If you need a refill on any your medications before your next appointment, please call your pharmacy first. If no refills are authorized on file call the office.-- Lab Work: Your physician has recommended that you have lab work today: none ordered today If you have labs (blood work) drawn today and your tests are completely normal, you will receive your results via MyChart message OR a phone call from our staff.  Please ensure you check your voicemail in the event that you authorized detailed messages to be left on a delegated number. If you have any lab test that is abnormal or we need to change your treatment, we will call you to review the results.  Referrals/Procedures/Imaging:    Follow-Up: Your next appointment:   Your physician recommends that you schedule a follow-up appointment in: 2-3 months for follow up with Dr. [name], 2 weeks for a lab visit.   You will receive a text message or e-mail with a link to a survey about your care and experience with us  today! We would greatly appreciate your feedback!   Thanks for letting us  be apart of your health journey!!  Primary Care and Sports Medicine   Dr. Quintin sheerer Peru   We encourage you to activate your patient portal called MyChart.  Sign up information is provided on this After Visit Summary.  MyChart is used to connect with patients for Virtual Visits (Telemedicine).  Patients are able to view lab/test results, encounter notes, upcoming appointments, etc.  Non-urgent messages can be sent to your provider as well. To learn more about what you can do with MyChart, please visit --  [email or web address].

## 2024-02-29 NOTE — Assessment & Plan Note (Signed)
 Blood pressure slightly elevated today.  He is currently taking low-dose amlodipine , however did stop chlorthalidone .  Denies any issues with chlorthalidone  previously, no side effects reported.  He has had evaluation with both nephrology and hematology. Discussed importance of maintaining good blood pressure control, particular in regards to underlying CKD.  Given that he previously was tolerating chlorthalidone  well, can resume this in conjunction with amlodipine . Recommend returning for labs in about 2 weeks to monitor response to chlorthalidone .  Recommend intermittent monitoring of blood pressure at home, DASH diet

## 2024-02-29 NOTE — Assessment & Plan Note (Signed)
 Patient has had evaluation with hematology regarding ongoing neutropenia.  It does appear that evaluation has largely been unremarkable/reassuring.  Based upon evaluation, it is thought that low neutrophil count is most likely related to benign/normal variant.  As a result, no specific treatment needed at this time, recommendation for intermittent monitoring moving forward.

## 2024-02-29 NOTE — Progress Notes (Signed)
    Procedures performed today:    None.  Independent interpretation of notes and tests performed by another provider:   None.  Brief History, Exam, Impression, and Recommendations:    BP (!) 146/99 Comment: pt states that BP has been going up after being stable for a while  Pulse 75   Ht 5' 7 (1.702 m)   Wt 205 lb 3.2 oz (93.1 kg)   SpO2 98%   BMI 32.14 kg/m   Essential hypertension Assessment & Plan: Blood pressure slightly elevated today.  He is currently taking low-dose amlodipine , however did stop chlorthalidone .  Denies any issues with chlorthalidone  previously, no side effects reported.  He has had evaluation with both nephrology and hematology. Discussed importance of maintaining good blood pressure control, particular in regards to underlying CKD.  Given that he previously was tolerating chlorthalidone  well, can resume this in conjunction with amlodipine . Recommend returning for labs in about 2 weeks to monitor response to chlorthalidone .  Recommend intermittent monitoring of blood pressure at home, DASH diet  Orders: -     CBC with Differential/Platelet; Future -     Basic metabolic panel with GFR; Future  Neutropenia, unspecified type [facility]) Assessment & Plan: Patient has had evaluation with hematology regarding ongoing neutropenia.  It does appear that evaluation has largely been unremarkable/reassuring.  Based upon evaluation, it is thought that low neutrophil count is most likely related to benign/normal variant.  As a result, no specific treatment needed at this time, recommendation for intermittent monitoring moving forward.  Orders: -     CBC with Differential/Platelet; Future  Other orders -     Chlorthalidone ; Take 1 tablet (25 mg total) by mouth daily.  Dispense: 90 tablet; Refill: 1  Return in about 3 months (around 05/31/2024) for hypertension, med check.   ___________________________________________ Adam Silva [name], MD, ABFM, CAQSM Primary Care and  Sports Medicine [facility]

## 2024-03-06 ENCOUNTER — Other Ambulatory Visit (HOSPITAL_BASED_OUTPATIENT_CLINIC_OR_DEPARTMENT_OTHER): Payer: Self-pay

## 2024-03-12 ENCOUNTER — Other Ambulatory Visit (HOSPITAL_BASED_OUTPATIENT_CLINIC_OR_DEPARTMENT_OTHER): Payer: Self-pay | Admitting: *Deleted

## 2024-03-12 DIAGNOSIS — D709 Neutropenia, unspecified: Secondary | ICD-10-CM | POA: Diagnosis not present

## 2024-03-12 DIAGNOSIS — I10 Essential (primary) hypertension: Secondary | ICD-10-CM

## 2024-03-12 LAB — CBC WITH DIFFERENTIAL/PLATELET
Basophils Absolute: 0 x10E3/uL (ref 0.0–0.2)
Basos: 1 %
EOS (ABSOLUTE): 0.1 x10E3/uL (ref 0.0–0.4)
Eos: 3 %
Hematocrit: 47.9 % (ref 37.5–51.0)
Hemoglobin: 15.3 g/dL (ref 13.0–17.7)
Immature Grans (Abs): 0 x10E3/uL (ref 0.0–0.1)
Immature Granulocytes: 0 %
Lymphocytes Absolute: 1.4 x10E3/uL (ref 0.7–3.1)
Lymphs: 54 %
MCH: 27.5 pg (ref 26.6–33.0)
MCHC: 31.9 g/dL (ref 31.5–35.7)
MCV: 86 fL (ref 79–97)
Monocytes Absolute: 0.3 x10E3/uL (ref 0.1–0.9)
Monocytes: 13 %
Neutrophils Absolute: 0.8 x10E3/uL — ABNORMAL LOW (ref 1.4–7.0)
Neutrophils: 29 %
Platelets: 196 x10E3/uL (ref 150–450)
RBC: 5.56 x10E6/uL (ref 4.14–5.80)
RDW: 13.9 % (ref 11.6–15.4)
WBC: 2.7 x10E3/uL — ABNORMAL LOW (ref 3.4–10.8)

## 2024-03-12 LAB — BASIC METABOLIC PANEL WITH GFR
BUN/Creatinine Ratio: 9 (ref 9–20)
BUN: 13 mg/dL (ref 6–24)
CO2: 24 mmol/L (ref 20–29)
Calcium: 9.4 mg/dL (ref 8.7–10.2)
Chloride: 99 mmol/L (ref 96–106)
Creatinine, Ser: 1.39 mg/dL — ABNORMAL HIGH (ref 0.76–1.27)
Glucose: 92 mg/dL (ref 70–99)
Potassium: 3.7 mmol/L (ref 3.5–5.2)
Sodium: 137 mmol/L (ref 134–144)
eGFR: 61 mL/min/1.73 (ref >59)

## 2024-03-26 ENCOUNTER — Ambulatory Visit (INDEPENDENT_AMBULATORY_CARE_PROVIDER_SITE_OTHER): Admitting: Family Medicine

## 2024-03-26 ENCOUNTER — Encounter (HOSPITAL_BASED_OUTPATIENT_CLINIC_OR_DEPARTMENT_OTHER): Payer: Self-pay | Admitting: Family Medicine

## 2024-03-26 VITALS — BP 136/96 | HR 68 | Ht 67.0 in | Wt 207.4 lb

## 2024-03-26 DIAGNOSIS — I10 Essential (primary) hypertension: Secondary | ICD-10-CM

## 2024-03-26 NOTE — Assessment & Plan Note (Signed)
 Blood pressure slightly elevated today, improved compared to prior visit.  He is currently taking chlorthalidone , however did stop amlodipine .  Denies any issues with chlorthalidone , no side effects reported.  He has had evaluation with both nephrology and hematology.  He reports that he has been checking his blood pressure at home and has log of readings.  Generally readings have been in the 110s systolic, 70s and 80s diastolic. Discussed importance of maintaining good blood pressure control, particular in regards to underlying CKD. Reviewed considerations, ideally patient would have been continuing with amlodipine  and chlorthalidone , however given that blood pressure has been controlled at home on chlorthalidone  alone, can continue with this at this time Recommend intermittent monitoring of blood pressure at home, DASH diet

## 2024-03-26 NOTE — Patient Instructions (Signed)
  Medication Instructions:  Your physician recommends that you continue on your current medications as directed. Please refer to the Current Medication list given to you today. --If you need a refill on any your medications before your next appointment, please call your pharmacy first. If no refills are authorized on file call the office.--   Follow-Up: Your next appointment:   Your physician recommends that you schedule a follow-up appointment in: keep upcoming appt  with Dr. [name]  You will receive a text message or e-mail with a link to a survey about your care and experience with us  today! We would greatly appreciate your feedback!   Thanks for letting us  be apart of your health journey!!  Primary Care and Sports Medicine   Dr. Quintin sheerer Peru   We encourage you to activate your patient portal called MyChart.  Sign up information is provided on this After Visit Summary.  MyChart is used to connect with patients for Virtual Visits (Telemedicine).  Patients are able to view lab/test results, encounter notes, upcoming appointments, etc.  Non-urgent messages can be sent to your provider as well. To learn more about what you can do with MyChart, please visit --  [email or web address].

## 2024-03-26 NOTE — Progress Notes (Signed)
    Procedures performed today:    None.  Independent interpretation of notes and tests performed by another provider:   None.  Brief History, Exam, Impression, and Recommendations:    BP (!) 136/96 (BP Location: Left Arm, Patient Position: Sitting, Cuff Size: Large)   Pulse 68   Ht 5' 7 (1.702 m)   Wt 207 lb 6.4 oz (94.1 kg)   SpO2 99%   BMI 32.48 kg/m   Essential hypertension Assessment & Plan: Blood pressure slightly elevated today, improved compared to prior visit.  He is currently taking chlorthalidone , however did stop amlodipine .  Denies any issues with chlorthalidone , no side effects reported.  He has had evaluation with both nephrology and hematology.  He reports that he has been checking his blood pressure at home and has log of readings.  Generally readings have been in the 110s systolic, 70s and 80s diastolic. Discussed importance of maintaining good blood pressure control, particular in regards to underlying CKD. Reviewed considerations, ideally patient would have been continuing with amlodipine  and chlorthalidone , however given that blood pressure has been controlled at home on chlorthalidone  alone, can continue with this at this time Recommend intermittent monitoring of blood pressure at home, DASH diet   Return for Keep appointment in November..   ___________________________________________ Kimetha Trulson [name], MD, ABFM, CAQSM Primary Care and Sports Medicine [facility]

## 2024-05-26 ENCOUNTER — Telehealth (HOSPITAL_BASED_OUTPATIENT_CLINIC_OR_DEPARTMENT_OTHER): Payer: Self-pay | Admitting: *Deleted

## 2024-05-26 NOTE — Telephone Encounter (Signed)
 Copied from CRM [id number]. Topic: General - Call Back - No Documentation >> May 26, 2024  2:51 PM Harlene ORN wrote: Reason for CRM: patient called. Said he missed a call from the practice. Called and asked the front desk. She asked the clinical staff; no one called him. Will document the call.

## 2024-06-02 ENCOUNTER — Encounter (HOSPITAL_BASED_OUTPATIENT_CLINIC_OR_DEPARTMENT_OTHER): Payer: Self-pay | Admitting: Family Medicine

## 2024-06-02 ENCOUNTER — Other Ambulatory Visit (HOSPITAL_BASED_OUTPATIENT_CLINIC_OR_DEPARTMENT_OTHER): Payer: Self-pay

## 2024-06-02 ENCOUNTER — Ambulatory Visit (INDEPENDENT_AMBULATORY_CARE_PROVIDER_SITE_OTHER): Admitting: Family Medicine

## 2024-06-02 VITALS — BP 122/71 | HR 65 | Ht 67.0 in | Wt 204.0 lb

## 2024-06-02 DIAGNOSIS — Z1211 Encounter for screening for malignant neoplasm of colon: Secondary | ICD-10-CM | POA: Diagnosis not present

## 2024-06-02 DIAGNOSIS — I10 Essential (primary) hypertension: Secondary | ICD-10-CM | POA: Diagnosis not present

## 2024-06-02 DIAGNOSIS — Z0000 Encounter for general adult medical examination without abnormal findings: Secondary | ICD-10-CM

## 2024-06-02 MED ORDER — CHLORTHALIDONE 25 MG PO TABS
12.5000 mg | ORAL_TABLET | Freq: Every day | ORAL | 1 refills | Status: AC
  Filled 2024-06-02 (×2): qty 15, 30d supply, fill #0

## 2024-06-02 NOTE — Assessment & Plan Note (Signed)
 Blood pressure controlled today, improved compared to prior visit.  He is currently taking chlorthalidone .  Was taking amlodipine  in the past but had stopped on his own - no side effects from amlodipine , just felt it wasn't working. Denies any issues with chlorthalidone , no side effects reported - reports taking half dose currently.  He has had evaluation with both nephrology and hematology.  He reports that he has been checking his blood pressure at home and has log of readings.  Generally readings have been in the 110s systolic, 70s and 80s diastolic. Discussed importance of maintaining good blood pressure control, particular in regards to underlying CKD. Can continue with current medication regimen, no changes today Recommend intermittent monitoring of blood pressure at home, DASH diet

## 2024-06-02 NOTE — Progress Notes (Signed)
    Procedures performed today:    None.  Independent interpretation of notes and tests performed by another provider:   None.  Brief History, Exam, Impression, and Recommendations:    BP 122/71 (BP Location: Right Arm, Patient Position: Sitting, Cuff Size: Large)   Pulse 65   Ht 5' 7 (1.702 m)   Wt 204 lb (92.5 kg)   SpO2 98%   BMI 31.95 kg/m   Essential hypertension Assessment & Plan: Blood pressure controlled today, improved compared to prior visit.  He is currently taking chlorthalidone .  Was taking amlodipine  in the past but had stopped on his own - no side effects from amlodipine , just felt it wasn't working. Denies any issues with chlorthalidone , no side effects reported - reports taking half dose currently.  He has had evaluation with both nephrology and hematology.  He reports that he has been checking his blood pressure at home and has log of readings.  Generally readings have been in the 110s systolic, 70s and 80s diastolic. Discussed importance of maintaining good blood pressure control, particular in regards to underlying CKD. Can continue with current medication regimen, no changes today Recommend intermittent monitoring of blood pressure at home, DASH diet   Colon cancer screening -     Ambulatory referral to Gastroenterology  Wellness examination -     CBC with Differential/Platelet; Future -     Comprehensive metabolic panel with GFR; Future -     Hemoglobin A1c; Future -     Lipid panel; Future -     TSH Rfx on Abnormal to Free T4; Future  Other orders -     Chlorthalidone ; Take 0.5 tablets (12.5 mg total) by mouth daily.  Dispense: 90 tablet; Refill: 1  Return in about 5 months (around 10/31/2024) for CPE with fasting labs 1 week prior.   ___________________________________________ Adam Weingarten de [place], MD, ABFM, CAQSM Primary Care and Sports Medicine [facility]

## 2024-06-17 NOTE — Progress Notes (Unsigned)
      [facility]   [phone] [phone]    Patient Care Team: [place], Quintin PARAS, MD as PCP - General (Family Medicine)   CHIEF COMPLAINT: Follow up neutropenia   CURRENT THERAPY: Observation   INTERVAL HISTORY Mr. Larsh returns for follow up as scheduled. Last seen 12/20/23. He stopped chlorthalidone  to see if that affected the low WBC.   ROS   Past Medical History:  Diagnosis Date   Hypertension      No past surgical history on file.   Outpatient Encounter Medications as of 06/18/2024  Medication Sig   chlorthalidone  (HYGROTON ) 25 MG tablet Take ONE-HALF tablet (12.5 mg total) by mouth daily.   No facility-administered encounter medications on file as of 06/18/2024.     There were no vitals filed for this visit. There is no height or weight on file to calculate BMI.   ECOG PERFORMANCE STATUS: {CHL ONC ECOG PS:[id number]}  PHYSICAL EXAM GENERAL:alert, no distress and comfortable SKIN: no rash  EYES: sclera clear NECK: without mass LYMPH:  no palpable cervical or supraclavicular lymphadenopathy  LUNGS: clear with normal breathing effort HEART: regular rate & rhythm, no lower extremity edema ABDOMEN: abdomen soft, non-tender and normal bowel sounds NEURO: alert & oriented x 3 with fluent speech, no focal motor/sensory deficits Breast exam:  PAC without erythema    CBC    Latest Ref Rng & Units 03/12/2024    9:05 AM 12/20/2023    8:05 AM 11/23/2023    8:54 AM  CBC  WBC 3.4 - 10.8 x10E3/uL 2.7  2.3  2.6   Hemoglobin 13.0 - 17.7 g/dL 84.6  84.5  85.1   Hematocrit 37.5 - 51.0 % 47.9  48.5    45.6  46.2   Platelets 150 - 450 x10E3/uL 196  184  182       CMP     Latest Ref Rng & Units 03/12/2024    9:05 AM 10/03/2023    9:05 AM  CMP  Glucose 70 - 99 mg/dL 92  99   BUN 6 - 24 mg/dL 13  14   Creatinine 9.23 - 1.27 mg/dL 8.60  8.49   Sodium 865 - 144 mmol/L 137  141   Potassium 3.5 - 5.2 mmol/L 3.7  4.7   Chloride 96 - 106  mmol/L 99  103   CO2 20 - 29 mmol/L 24  26   Calcium 8.7 - 10.2 mg/dL 9.4  89.8   Total Protein 6.0 - 8.5 g/dL  7.2   Total Bilirubin 0.0 - 1.2 mg/dL  1.0   Alkaline Phos 44 - 121 IU/L  70   AST 0 - 40 IU/L  36   ALT 0 - 44 IU/L  27       ASSESSMENT & PLAN:  PLAN:  No orders of the defined types were placed in this encounter.     All questions were answered. The patient knows to call the clinic with any problems, questions or concerns. No barriers to learning were detected. I spent *** counseling the patient face to face. The total time spent in the appointment was *** and more than 50% was on counseling, review of test results, and coordination of care.   Shanoah Asbill K Meeya Goldin, NP 06/17/2024 6:05 PM

## 2024-06-18 ENCOUNTER — Encounter: Payer: Self-pay | Admitting: Nurse Practitioner

## 2024-06-18 ENCOUNTER — Inpatient Hospital Stay (HOSPITAL_BASED_OUTPATIENT_CLINIC_OR_DEPARTMENT_OTHER): Admitting: Nurse Practitioner

## 2024-06-18 ENCOUNTER — Inpatient Hospital Stay: Attending: Nurse Practitioner

## 2024-06-18 VITALS — BP 120/79 | HR 56 | Temp 97.5°F | Resp 18 | Ht 67.0 in | Wt 204.7 lb

## 2024-06-18 DIAGNOSIS — D709 Neutropenia, unspecified: Secondary | ICD-10-CM

## 2024-06-18 DIAGNOSIS — I10 Essential (primary) hypertension: Secondary | ICD-10-CM | POA: Diagnosis not present

## 2024-06-18 LAB — CBC WITH DIFFERENTIAL (CANCER CENTER ONLY)
Abs Immature Granulocytes: 0 K/uL (ref 0.00–0.07)
Basophils Absolute: 0 K/uL (ref 0.0–0.1)
Basophils Relative: 1 %
Eosinophils Absolute: 0 K/uL (ref 0.0–0.5)
Eosinophils Relative: 2 %
HCT: 45.8 % (ref 39.0–52.0)
Hemoglobin: 15.5 g/dL (ref 13.0–17.0)
Immature Granulocytes: 0 %
Lymphocytes Relative: 57 %
Lymphs Abs: 1.3 K/uL (ref 0.7–4.0)
MCH: 27.4 pg (ref 26.0–34.0)
MCHC: 33.8 g/dL (ref 30.0–36.0)
MCV: 81.1 fL (ref 80.0–100.0)
Monocytes Absolute: 0.2 K/uL (ref 0.1–1.0)
Monocytes Relative: 10 %
Neutro Abs: 0.7 K/uL — ABNORMAL LOW (ref 1.7–7.7)
Neutrophils Relative %: 30 %
Platelet Count: 207 K/uL (ref 150–400)
RBC: 5.65 MIL/uL (ref 4.22–5.81)
RDW: 13.6 % (ref 11.5–15.5)
WBC Count: 2.3 K/uL — ABNORMAL LOW (ref 4.0–10.5)
nRBC: 0 % (ref 0.0–0.2)

## 2024-08-29 ENCOUNTER — Encounter: Payer: Self-pay | Admitting: Internal Medicine

## 2024-10-16 ENCOUNTER — Inpatient Hospital Stay

## 2024-11-05 ENCOUNTER — Encounter (HOSPITAL_BASED_OUTPATIENT_CLINIC_OR_DEPARTMENT_OTHER): Admitting: Family Medicine

## 2025-02-19 ENCOUNTER — Inpatient Hospital Stay

## 2025-02-19 ENCOUNTER — Inpatient Hospital Stay: Admitting: Nurse Practitioner
